# Patient Record
Sex: Male | Born: 1975 | Race: White | Hispanic: Yes | Marital: Married | Smoking: Former smoker
Health system: Southern US, Community
[De-identification: ages and names within clinical notes are randomized; demographics above are authoritative.]

## PROBLEM LIST (undated history)

## (undated) DIAGNOSIS — F988 Other specified behavioral and emotional disorders with onset usually occurring in childhood and adolescence: Secondary | ICD-10-CM

## (undated) DIAGNOSIS — R51 Headache: Secondary | ICD-10-CM

## (undated) DIAGNOSIS — T7840XA Allergy, unspecified, initial encounter: Secondary | ICD-10-CM

## (undated) DIAGNOSIS — J302 Other seasonal allergic rhinitis: Secondary | ICD-10-CM

## (undated) DIAGNOSIS — R519 Headache, unspecified: Secondary | ICD-10-CM

## (undated) DIAGNOSIS — E785 Hyperlipidemia, unspecified: Secondary | ICD-10-CM

## (undated) HISTORY — DX: Headache, unspecified: R51.9

## (undated) HISTORY — PX: HAND SURGERY: SHX662

## (undated) HISTORY — DX: Other specified behavioral and emotional disorders with onset usually occurring in childhood and adolescence: F98.8

## (undated) HISTORY — DX: Other seasonal allergic rhinitis: J30.2

## (undated) HISTORY — DX: Hyperlipidemia, unspecified: E78.5

## (undated) HISTORY — DX: Allergy, unspecified, initial encounter: T78.40XA

## (undated) HISTORY — DX: Headache: R51

---

## 2015-02-17 ENCOUNTER — Encounter: Payer: Self-pay | Admitting: Gastroenterology

## 2015-02-25 ENCOUNTER — Other Ambulatory Visit: Payer: Self-pay | Admitting: Orthopedic Surgery

## 2015-02-25 DIAGNOSIS — M4726 Other spondylosis with radiculopathy, lumbar region: Secondary | ICD-10-CM

## 2015-03-17 ENCOUNTER — Ambulatory Visit
Admission: RE | Admit: 2015-03-17 | Discharge: 2015-03-17 | Disposition: A | Discharge status: Home / Self Care | Payer: BLUE CROSS/BLUE SHIELD | Source: Ambulatory Visit | Attending: Orthopedic Surgery | Admitting: Orthopedic Surgery

## 2015-03-17 ENCOUNTER — Other Ambulatory Visit: Payer: Self-pay | Admitting: Orthopedic Surgery

## 2015-03-17 DIAGNOSIS — M4726 Other spondylosis with radiculopathy, lumbar region: Secondary | ICD-10-CM

## 2015-03-17 DIAGNOSIS — Z77018 Contact with and (suspected) exposure to other hazardous metals: Secondary | ICD-10-CM

## 2015-04-08 ENCOUNTER — Ambulatory Visit (INDEPENDENT_AMBULATORY_CARE_PROVIDER_SITE_OTHER): Payer: BLUE CROSS/BLUE SHIELD | Admitting: Gastroenterology

## 2015-04-08 ENCOUNTER — Encounter: Payer: Self-pay | Admitting: Gastroenterology

## 2015-04-08 VITALS — BP 130/80 | Ht 69.0 in | Wt 183.5 lb

## 2015-04-08 DIAGNOSIS — Z1211 Encounter for screening for malignant neoplasm of colon: Secondary | ICD-10-CM | POA: Diagnosis not present

## 2015-04-08 DIAGNOSIS — Z800 Family history of malignant neoplasm of digestive organs: Secondary | ICD-10-CM | POA: Diagnosis not present

## 2015-04-08 MED ORDER — NA SULFATE-K SULFATE-MG SULF 17.5-3.13-1.6 GM/177ML PO SOLN: ORAL | Status: DC

## 2015-04-08 NOTE — Progress Notes (Signed)
   HPI :  38 y/o male seen in consultation for colon cancer screening. His mother had colon cancer diagnosed at age 66. No other family members with colon cancer.   No prior colonoscopy. He denies any blood in the stools. No abdominal pains. No diarrhea or constipation. Eating well without complaints. No vomiting or nausea. He has lost roughly 12 lbs in 3-4 months when he started Adderrall and reports this was expected weight loss. He takes this for ADD.   Otherwise healthy without complaints. No history of anemia.    Past Medical History  Diagnosis Date  . Seasonal allergies   . Hyperlipidemia   . ADD (attention deficit disorder)      Past Surgical History  Procedure Laterality Date  . Hand surgery     Family History  Problem Relation Age of Onset  . Colon cancer Mother   . Diabetes Paternal Grandmother   . Hypertension Father   . Hypertension Mother   . ADD / ADHD Son    Social History  Substance Use Topics  . Smoking status: Former Research scientist (life sciences)  . Smokeless tobacco: None  . Alcohol Use: 1.8 oz/week    3 Standard drinks or equivalent per week     Comment: beer   Current Outpatient Prescriptions  Medication Sig Dispense Refill  . amphetamine-dextroamphetamine (ADDERALL) 10 MG tablet Take 10 mg by mouth 2 (two) times a week.     No current facility-administered medications for this visit.   No Known Allergies   Review of Systems: All systems reviewed and negative except where noted in HPI.   Labs from other provider reviewed from 12/13/2014 - normal Hgb of 14.6  Physical Exam: Ht 5\' 9"  (1.753 m)  Wt 183 lb 8 oz (83.235 kg)  BMI 27.09 kg/m2 Constitutional: Pleasant,well-developed, male in no acute distress. HEENT: Normocephalic and atraumatic. Conjunctivae are normal. No scleral icterus. Neck supple.  Cardiovascular: Normal rate, regular rhythm.  Pulmonary/chest: Effort normal and breath sounds normal. No wheezing, rales or rhonchi. Abdominal: Soft, nondistended,  nontender. Bowel sounds active throughout. There are no masses palpable. No hepatomegaly. Extremities: no edema Lymphadenopathy: No cervical adenopathy noted. Neurological: Alert and oriented to person place and time. Skin: Skin is warm and dry. No rashes noted. Psychiatric: Normal mood and affect. Behavior is normal.   ASSESSMENT AND PLAN: 39 y/o male with mother who had colon cancer at age 55, in need of first time CRC screening. He has no anemia or alarm symptoms. Generally feels well without complaints.   We discussed risk factors for colon cancer, and recommended colon cancer screening modalities and timing of screening modalities given his family history based on ACG guidelines. Given his family history recommend a colonoscopy starting now at age 46, and then every 5 years even if it is a normal exam, or sooner based on colonoscopy findings of he has multiple or high risk polyps .  The indications, risks, and benefits of colonoscopy were explained to the patient in detail. Risks include but are not limited to bleeding, perforation, adverse reaction to medications, and cardiopulmonary compromise. Sequelae include but are not limited to the possibility of surgery, hospitalization, and mortality. The patient verbalized understanding and wished to proceed. All questions answered, referred to the scheduler and bowel prep ordered. Further recommendations pending results of the exam.   [place] Cellar, MD [facility] Pager [phone]

## 2015-04-08 NOTE — Patient Instructions (Signed)
You have been scheduled for a colonoscopy. Please follow written instructions given to you at your visit today.  Please pick up your prep supplies at the pharmacy within the next 1-3 days. If you use inhalers (even only as needed), please bring them with you on the day of your procedure. Your physician has requested that you go to www.startemmi.com and enter the access code given to you at your visit today. This web site gives a general overview about your procedure. However, you should still follow specific instructions given to you by our office regarding your preparation for the procedure.  We have sent the following medications to your pharmacy for you to pick up at your convenience: Suprep  

## 2015-05-18 ENCOUNTER — Ambulatory Visit (INDEPENDENT_AMBULATORY_CARE_PROVIDER_SITE_OTHER): Payer: BLUE CROSS/BLUE SHIELD | Admitting: Medical

## 2015-05-18 ENCOUNTER — Encounter: Payer: Self-pay | Admitting: Medical

## 2015-05-18 VITALS — BP 120/82 | HR 88 | Temp 98.7°F | Ht 69.25 in | Wt 183.0 lb

## 2015-05-18 DIAGNOSIS — R0683 Snoring: Secondary | ICD-10-CM | POA: Insufficient documentation

## 2015-05-18 DIAGNOSIS — R51 Headache: Secondary | ICD-10-CM

## 2015-05-18 DIAGNOSIS — F988 Other specified behavioral and emotional disorders with onset usually occurring in childhood and adolescence: Secondary | ICD-10-CM | POA: Insufficient documentation

## 2015-05-18 DIAGNOSIS — J302 Other seasonal allergic rhinitis: Secondary | ICD-10-CM

## 2015-05-18 DIAGNOSIS — R5383 Other fatigue: Secondary | ICD-10-CM | POA: Diagnosis not present

## 2015-05-18 DIAGNOSIS — F909 Attention-deficit hyperactivity disorder, unspecified type: Secondary | ICD-10-CM

## 2015-05-18 DIAGNOSIS — E785 Hyperlipidemia, unspecified: Secondary | ICD-10-CM | POA: Diagnosis not present

## 2015-05-18 DIAGNOSIS — R519 Headache, unspecified: Secondary | ICD-10-CM | POA: Insufficient documentation

## 2015-05-18 NOTE — Assessment & Plan Note (Signed)
For your HA will work up your sleep issues and see if this resolves. During interim use alleve. If severe ha with neurologic signs or symptoms then ED evaluation. May in future get imaging studies or refer to neurologist depending on how you do.

## 2015-05-18 NOTE — Assessment & Plan Note (Addendum)
he noticed this about 3 years ago. Pt in past thought related to work travel and studying for [employer]. Future labs next week cbc, cmp, tsh.

## 2015-05-18 NOTE — Patient Instructions (Addendum)
For your ADD will follow but continue to see [place] attention specialist if you feel is needed.  For your fatigue and hyperlipidemia will get fasting labs within next week.   For your HA will work up your sleep issues and see if this resolves. During interim use alleve. If severe ha with neurologic signs or symptoms then ED evaluation. May in future get imaging studies or refer to neurologist depending on how you do.  Will refer to specialist for sleep apnea.  Would try to get more exercise if possible but early in the day.  Follow up in 3 wks or as needed

## 2015-05-18 NOTE — Assessment & Plan Note (Signed)
For your ADD will follow but continue to see [place] attention specialist if you feel is needed.

## 2015-05-18 NOTE — Progress Notes (Signed)
Subjective:    Patient ID: Keith Bryan, male    DOB: 08/11/1975, 39 y.o.   MRN: [id number]  HPI   I have reviewed pt PMH, PSH, FH, Social History and Surgical History  Seasonal allergies- pt takes allegra or  zyrtec occasional. Worse in the spring.  ADD- Pt had dx 6 years ago. Since youth had extreme difficulty concentrating. Pt has been using med for years. Pt has not taken adderal in one month.  Fatigue- he noticed this about 3 years ago. Pt in past thought related to work travel and studying for [employer].   Ha-no light sensitive or sound sensitivity. He will wake moderate ha for 1-2 hours then eases up. When he rests well and sleeps well less ha. When he uses respiratory mouth device he notice ha sometimes not present.  Pt states he sleeps 7-8 hours and does not feel well rested. He bought otc mouth device and he feels like he feels better rested. Pt wife state sthat he snores. Pt tried various sleep hygeine measures but still does not feel rested.  Hyperlipidemia- pt does not recall last reading. Was on medication simvistatin up until last year. Last check 6 months.  Back pain- pt seeing neurosurgeon. Also has seen PT. Back is better now.  Has MBA works at  [employer], Not working out recently, 2 cups coffee a day, married- 2 kids.  Review of Systems  Constitutional: Positive for fatigue. Negative for chills and unexpected weight change.  HENT: Negative for congestion, facial swelling, nosebleeds, rhinorrhea, sinus pressure and tinnitus.   Respiratory: Negative for apnea, cough, shortness of breath and wheezing.   Cardiovascular: Negative for chest pain and palpitations.  Gastrointestinal: Negative for abdominal pain.  Musculoskeletal: Negative for back pain.  Neurological: Negative for dizziness and light-headedness.  Hematological: Negative for adenopathy.  Psychiatric/Behavioral: Negative for suicidal ideas, confusion and dysphoric mood. The patient is not nervous/anxious.       Snoring.    Past Medical History  Diagnosis Date  . Seasonal allergies   . Hyperlipidemia   . ADD (attention deficit disorder)   . Frequent headaches   . Allergy     Social History   Social History  . Marital Status: Married    Spouse Name: N/A  . Number of Children: 2  . Years of Education: N/A   Occupational History  . scientist    Social History Main Topics  . Smoking status: Former Research scientist (life sciences)  . Smokeless tobacco: Never Used  . Alcohol Use: 1.8 oz/week    3 Standard drinks or equivalent per week     Comment: beer occasionally   . Drug Use: No  . Sexual Activity: Not on file   Other Topics Concern  . Not on file   Social History Narrative    Past Surgical History  Procedure Laterality Date  . Hand surgery      Family History  Problem Relation Age of Onset  . Colon cancer Mother   . Hypertension Mother   . Hyperlipidemia Mother   . Diabetes Paternal Grandmother   . Arthritis Paternal Grandmother   . Hypertension Father   . Hyperlipidemia Father   . ADD / ADHD Son   . Arthritis Maternal Grandmother   . Arthritis Maternal Grandfather   . Arthritis Paternal Grandfather     No Known Allergies  Current Outpatient Prescriptions on File Prior to Visit  Medication Sig Dispense Refill  . amphetamine-dextroamphetamine (ADDERALL) 10 MG tablet Take 10 mg by mouth 2 (  two) times a week.     No current facility-administered medications on file prior to visit.    BP 120/82 mmHg  Pulse 88  Temp(Src) 98.7 F (37.1 C) (Oral)  Ht 5' 9.25" (1.759 m)  Wt 183 lb (83.008 kg)  BMI 26.83 kg/m2  SpO2 98%        Objective:   Physical Exam  General Mental Status- Alert. General Appearance- Not in acute distress.   Skin General: Color- Normal Color. Moisture- Normal Moisture.  Neck Carotid Arteries- Normal color. Moisture- Normal Moisture. No carotid bruits. No JVD.  Chest and Lung Exam Auscultation: Breath Sounds:-Normal.  CTA.  Cardiovascular Auscultation:Rythm- Regular, Rate, and rythm Murmurs & Other Heart Sounds:Auscultation of the heart reveals- No Murmurs.  Abdomen Inspection:-Inspeection Normal. Palpation/Percussion:Note:No mass. Palpation and Percussion of the abdomen reveal- Non Tender, Non Distended + BS, no rebound or guarding.  Neurologic Cranial Nerve exam:- CN III-XII intact(No nystagmus), symmetric smile. Strength:- 5/5 equal and symmetric strength both upper and lower extremities.      Assessment & Plan:

## 2015-05-18 NOTE — Progress Notes (Signed)
Pre visit review using our clinic review tool, if applicable. No additional management support is needed unless otherwise documented below in the visit note. 

## 2015-05-18 NOTE — Assessment & Plan Note (Signed)
pt takes allegra or  zyrtec occasional. Worse in the spring

## 2015-05-18 NOTE — Assessment & Plan Note (Signed)
With non restorative sleep. Fatigue. Will refer for sleep apnea eval.

## 2015-05-23 ENCOUNTER — Other Ambulatory Visit (INDEPENDENT_AMBULATORY_CARE_PROVIDER_SITE_OTHER): Payer: BLUE CROSS/BLUE SHIELD

## 2015-05-23 DIAGNOSIS — R5383 Other fatigue: Secondary | ICD-10-CM

## 2015-05-23 DIAGNOSIS — E785 Hyperlipidemia, unspecified: Secondary | ICD-10-CM | POA: Diagnosis not present

## 2015-05-23 LAB — CBC WITH DIFFERENTIAL/PLATELET
BASOS ABS: 0 10*3/uL (ref 0.0–0.1)
BASOS PCT: 0.5 % (ref 0.0–3.0)
EOS ABS: 0.2 10*3/uL (ref 0.0–0.7)
Eosinophils Relative: 3.8 % (ref 0.0–5.0)
HEMATOCRIT: 43.5 % (ref 39.0–52.0)
Hemoglobin: 14.8 g/dL (ref 13.0–17.0)
LYMPHS PCT: 35.4 % (ref 12.0–46.0)
Lymphs Abs: 1.5 10*3/uL (ref 0.7–4.0)
MCHC: 34 g/dL (ref 30.0–36.0)
MCV: 85.5 fl (ref 78.0–100.0)
MONO ABS: 0.3 10*3/uL (ref 0.1–1.0)
Monocytes Relative: 7.1 % (ref 3.0–12.0)
NEUTROS ABS: 2.3 10*3/uL (ref 1.4–7.7)
NEUTROS PCT: 53.2 % (ref 43.0–77.0)
PLATELETS: 238 10*3/uL (ref 150.0–400.0)
RBC: 5.08 Mil/uL (ref 4.22–5.81)
RDW: 12.7 % (ref 11.5–15.5)
WBC: 4.3 10*3/uL (ref 4.0–10.5)

## 2015-05-23 LAB — COMPREHENSIVE METABOLIC PANEL
ALT: 19 U/L (ref 0–53)
AST: 18 U/L (ref 0–37)
Albumin: 4.4 g/dL (ref 3.5–5.2)
Alkaline Phosphatase: 66 U/L (ref 39–117)
BILIRUBIN TOTAL: 1 mg/dL (ref 0.2–1.2)
BUN: 16 mg/dL (ref 6–23)
CALCIUM: 9.7 mg/dL (ref 8.4–10.5)
CHLORIDE: 102 meq/L (ref 96–112)
CO2: 29 meq/L (ref 19–32)
CREATININE: 0.9 mg/dL (ref 0.40–1.50)
GFR: 99.26 mL/min (ref >60.00)
GLUCOSE: 89 mg/dL (ref 70–99)
Potassium: 3.9 mEq/L (ref 3.5–5.1)
SODIUM: 138 meq/L (ref 135–145)
Total Protein: 7.1 g/dL (ref 6.0–8.3)

## 2015-05-23 LAB — TSH: TSH: 2.67 u[IU]/mL (ref 0.35–4.50)

## 2015-05-23 LAB — LIPID PANEL
CHOL/HDL RATIO: 5
Cholesterol: 215 mg/dL — ABNORMAL HIGH (ref 0–200)
HDL: 42.8 mg/dL (ref >39.00)
NONHDL: 172.59
Triglycerides: 210 mg/dL — ABNORMAL HIGH (ref 0.0–149.0)
VLDL: 42 mg/dL — ABNORMAL HIGH (ref 0.0–40.0)

## 2015-05-23 LAB — LDL CHOLESTEROL, DIRECT: LDL DIRECT: 139 mg/dL

## 2015-05-27 ENCOUNTER — Telehealth: Payer: Self-pay

## 2015-05-27 DIAGNOSIS — E785 Hyperlipidemia, unspecified: Secondary | ICD-10-CM

## 2015-05-27 NOTE — Telephone Encounter (Signed)
Since muscle aches with simvistatin. Won't rx statin. Advise strict diet, exercise and krill oil for 3 months. Then repeat lipid panel fasting. See if still elevated then consider other rx options.

## 2015-05-27 NOTE — Telephone Encounter (Signed)
Spoke with pt and he states that he was previously on simvastatin and he had muscle cramps so he stopped medication. He stated that he has started exercising and watching the saturated fats in the diet. He is willing to try what would be recommended by PCP.

## 2015-05-27 NOTE — Telephone Encounter (Signed)
Pt notified and made aware of provider's recommendation.  He stated understanding and agreed with plan.  Repeat lab appointment scheduled.  Future lab appointment scheduled.

## 2015-06-09 ENCOUNTER — Encounter: Payer: Self-pay | Admitting: Medical

## 2015-06-09 ENCOUNTER — Ambulatory Visit (INDEPENDENT_AMBULATORY_CARE_PROVIDER_SITE_OTHER): Payer: BLUE CROSS/BLUE SHIELD | Admitting: Medical

## 2015-06-09 VITALS — BP 102/70 | HR 70 | Temp 98.0°F | Ht 69.0 in | Wt 180.0 lb

## 2015-06-09 DIAGNOSIS — E785 Hyperlipidemia, unspecified: Secondary | ICD-10-CM | POA: Diagnosis not present

## 2015-06-09 DIAGNOSIS — R0683 Snoring: Secondary | ICD-10-CM

## 2015-06-09 DIAGNOSIS — R5383 Other fatigue: Secondary | ICD-10-CM

## 2015-06-09 NOTE — Patient Instructions (Addendum)
See the pulmonologist for possible  sleep apnea. If your studies are not that impressive then consider occasional sleep agent.  Continue krill oil daily and exercise.  Discuss adderal impact use on sleep with specialist. They may increase on follow up with specialist.  Keith Bryan are resolved if they reoccur let me know.   Follow up end of march after lipid panel done

## 2015-06-09 NOTE — Progress Notes (Signed)
   Subjective:    Patient ID: Keith Bryan, male    DOB: 12/09/1975, 40 y.o.   MRN: [id number]  HPI  Pt in states he is feeling better energy after 12 days of vacation. Pt states while on vacation he slept  7-8 hours a day . When he works he sleeps about 6 hours. Sometimes watches tv until late. But he is changing this. He uses otc mouth device for presumed sleep apnea(got on [place]). He thinks this helps. He has sleep eval with pulmonologist late this month. Still snores.  Pt is on krill oil now for mild lipid elevation and exercising 20 minutes. Eating better.  Pt has follow up on [place] attention specialist. Pt will touch base with them about the impact of his med on his sleep.   Headache area much improved/not reoccring. He states these have stopped. He only has some rare  upper neck tightness early in the am.   Review of Systems  Constitutional: Negative for fever, chills, diaphoresis, activity change and fatigue.  Respiratory: Negative for cough, chest tightness and shortness of breath.   Cardiovascular: Negative for chest pain, palpitations and leg swelling.  Gastrointestinal: Negative for nausea, vomiting and abdominal pain.  Musculoskeletal: Negative for back pain, neck pain and neck stiffness.  Skin: Negative for rash.  Neurological: Negative for dizziness, seizures, speech difficulty, weakness, numbness and headaches.  Psychiatric/Behavioral: Positive for decreased concentration. Negative for behavioral problems, confusion and agitation. The patient is not nervous/anxious.        Will see his specialist for this.       Objective:   Physical Exam  General Mental Status- Alert. General Appearance- Not in acute distress.   Skin General: Color- Normal Color. Moisture- Normal Moisture.  Neck Carotid Arteries- Normal color. Moisture- Normal Moisture. No carotid bruits. No JVD.  Chest and Lung Exam Auscultation: Breath  Sounds:-Normal.  Cardiovascular Auscultation:Rythm- Regular. Murmurs & Other Heart Sounds:Auscultation of the heart reveals- No Murmurs.  Abdomen Inspection:-Inspeection Normal. Palpation/Percussion:Note:No mass. Palpation and Percussion of the abdomen reveal- Non Tender, Non Distended + BS, no rebound or guarding.    Neurologic Cranial Nerve exam:- CN III-XII intact(No nystagmus), symmetric smile. Strength:- 5/5 equal and symmetric strength both upper and lower extremities.      Assessment & Plan:  See the pulmonologist for possible  sleep apnea. If your studies are not that impressive then consider occasional sleep agent.  Continue krill oil daily and exercise.  Discuss adderal impact use on sleep with specialist. They may increase on follow up with specialist.  Lamonte Sakai are resolved if they reoccur let me know.   Follow up end of march after lipid panel done

## 2015-06-09 NOTE — Progress Notes (Signed)
Pre visit review using our clinic review tool, if applicable. No additional management support is needed unless otherwise documented below in the visit note. 

## 2015-06-22 ENCOUNTER — Encounter: Payer: Self-pay | Admitting: Gastroenterology

## 2015-06-22 ENCOUNTER — Ambulatory Visit (AMBULATORY_SURGERY_CENTER): Payer: BLUE CROSS/BLUE SHIELD | Admitting: Gastroenterology

## 2015-06-22 VITALS — BP 102/70 | HR 74 | Temp 98.1°F | Resp 12 | Ht 69.0 in | Wt 183.0 lb

## 2015-06-22 DIAGNOSIS — Z800 Family history of malignant neoplasm of digestive organs: Secondary | ICD-10-CM

## 2015-06-22 DIAGNOSIS — K635 Polyp of colon: Secondary | ICD-10-CM

## 2015-06-22 DIAGNOSIS — D124 Benign neoplasm of descending colon: Secondary | ICD-10-CM

## 2015-06-22 DIAGNOSIS — D123 Benign neoplasm of transverse colon: Secondary | ICD-10-CM | POA: Diagnosis not present

## 2015-06-22 DIAGNOSIS — D125 Benign neoplasm of sigmoid colon: Secondary | ICD-10-CM

## 2015-06-22 DIAGNOSIS — Z1211 Encounter for screening for malignant neoplasm of colon: Secondary | ICD-10-CM

## 2015-06-22 MED ORDER — SODIUM CHLORIDE 0.9 % IV SOLN: 500.0000 mL | INTRAVENOUS | Status: DC

## 2015-06-22 NOTE — Op Note (Signed)
[facility]  [address] [place], [id number]   COLONOSCOPY PROCEDURE REPORT  PATIENT: Keith Bryan, Keith Bryan  MR#: [id number] BIRTHDATE: [birth year] , 39  yrs. old GENDER: male ENDOSCOPIST: Yetta Flock, MD REFERRED BY: PROCEDURE DATE:  06/22/2015 PROCEDURE:   Colonoscopy, screening and Colonoscopy with snare polypectomy First Screening Colonoscopy - Avg.  risk and is 50 yrs.  old or older Yes.  Prior Negative Screening - Now for repeat screening. N/A  History of Adenoma - Now for follow-up colonoscopy & has been > or = to 3 yrs.  N/A  Polyps removed today? Yes ASA CLASS:   Class II INDICATIONS:Screening for colonic neoplasia and FH Colon or Rectal Adenocarcinoma. MEDICATIONS: Propofol 350 mg IV  DESCRIPTION OF PROCEDURE:   After the risks benefits and alternatives of the procedure were thoroughly explained, informed consent was obtained.  The digital rectal exam revealed no abnormalities of the rectum.   The LB [id number]  endoscope was introduced through the anus and advanced to the cecum, which was identified by both the appendix and ileocecal valve. No adverse events experienced.   The quality of the prep was good.  The instrument was then slowly withdrawn as the colon was fully examined. Estimated blood loss is zero unless otherwise noted in this procedure report.    COLON FINDINGS: A small nonbleeding AVM was noted in the cecum.  An isolated diverticulum was noted in the cecum.  A roughly 53mm sessile polyp was noted in the proximal transverse colon and removed with cold snare.  Two additional sessile transverse colon polyps roughly 4-15mm in size were removed with cold snare.  A 7mm sessile polyp was noted in the descending colon and removed via cold snare.  Three sessile polyps noted in the sigmoid colon roughly 4-23mm in size were removed with cold snare.  The remainder of the examined colon was normal.  Retroflexed views revealed internal  hemorrhoids. The time to cecum = 2.3 Withdrawal time = 25.8   The scope was withdrawn and the procedure completed. COMPLICATIONS: There were no immediate complications.     ENDOSCOPIC IMPRESSION: Small cecal AVM Isolated diverticulum in the cecum 7 polyps removed via cold snare Internal hemorrhoids  RECOMMENDATIONS: No NSAIDS for 2 weeks Await pathology results Resume diet Resume medications  eSigned:  Yetta Flock, MD 06/22/2015 3:00 PM   cc:  the patient   PATIENT NAME:  Keith Bryan, Keith Bryan MR#: [id number]

## 2015-06-22 NOTE — Progress Notes (Signed)
Called to room to assist during endoscopic procedure.  Patient ID and intended procedure confirmed with present staff. Received instructions for my participation in the procedure from the performing physician.  

## 2015-06-22 NOTE — Patient Instructions (Signed)
YOU HAD AN ENDOSCOPIC PROCEDURE TODAY AT [facility]:   Refer to the procedure report that was given to you for any specific questions about what was found during the examination.  If the procedure report does not answer your questions, please call your gastroenterologist to clarify.  If you requested that your care partner not be given the details of your procedure findings, then the procedure report has been included in a sealed envelope for you to review at your convenience later.  YOU SHOULD EXPECT: Some feelings of bloating in the abdomen. Passage of more gas than usual.  Walking can help get rid of the air that was put into your GI tract during the procedure and reduce the bloating. If you had a lower endoscopy (such as a colonoscopy or flexible sigmoidoscopy) you may notice spotting of blood in your stool or on the toilet paper. If you underwent a bowel prep for your procedure, you may not have a normal bowel movement for a few days.  Please Note:  You might notice some irritation and congestion in your nose or some drainage.  This is from the oxygen used during your procedure.  There is no need for concern and it should clear up in a day or so.  SYMPTOMS TO REPORT IMMEDIATELY:   Following lower endoscopy (colonoscopy or flexible sigmoidoscopy):  Excessive amounts of blood in the stool  Significant tenderness or worsening of abdominal pains  Swelling of the abdomen that is new, acute  Fever of 100F or higher  For urgent or emergent issues, a gastroenterologist can be reached at any hour by calling [phone].   DIET: Your first meal following the procedure should be a small meal and then it is ok to progress to your normal diet. Heavy or fried foods are harder to digest and may make you feel nauseous or bloated.  Likewise, meals heavy in dairy and vegetables can increase bloating.  Drink plenty of fluids but you should avoid alcoholic beverages for 24  hours.  ACTIVITY:  You should plan to take it easy for the rest of today and you should NOT DRIVE or use heavy machinery until tomorrow (because of the sedation medicines used during the test).    FOLLOW UP: Our staff will call the number listed on your records the next business day following your procedure to check on you and address any questions or concerns that you may have regarding the information given to you following your procedure. If we do not reach you, we will leave a message.  However, if you are feeling well and you are not experiencing any problems, there is no need to return our call.  We will assume that you have returned to your regular daily activities without incident.  If any biopsies were taken you will be contacted by phone or by letter within the next 1-3 weeks.  Please call us at [phone] if you have not heard about the biopsies in 3 weeks.    SIGNATURES/CONFIDENTIALITY: You and/or your care partner have signed paperwork which will be entered into your electronic medical record.  These signatures attest to the fact that that the information above on your After Visit Summary has been reviewed and is understood.  Full responsibility of the confidentiality of this discharge information lies with you and/or your care-partner.  Await pathology results Polyps/ hemorrhoids handout given Hold NSAIDS for 2 weeks

## 2015-06-22 NOTE — Progress Notes (Signed)
A/ox3, pleased with MAC, report to RN 

## 2015-06-23 ENCOUNTER — Telehealth: Payer: Self-pay | Admitting: *Deleted

## 2015-06-23 NOTE — Telephone Encounter (Signed)
Unable to leave message on f/u call as mailbox full

## 2015-06-24 ENCOUNTER — Ambulatory Visit (INDEPENDENT_AMBULATORY_CARE_PROVIDER_SITE_OTHER): Payer: BLUE CROSS/BLUE SHIELD | Admitting: Pulmonary Disease

## 2015-06-24 ENCOUNTER — Encounter: Payer: Self-pay | Admitting: Pulmonary Disease

## 2015-06-24 VITALS — BP 122/88 | HR 80 | Ht 69.0 in | Wt 180.0 lb

## 2015-06-24 DIAGNOSIS — R0683 Snoring: Secondary | ICD-10-CM | POA: Diagnosis not present

## 2015-06-24 NOTE — Patient Instructions (Signed)
We will schedule you for a home sleep study.   Follow-up as needed after results reviewed

## 2015-06-24 NOTE — Progress Notes (Signed)
Subjective:    Patient ID: Keith Bryan, male    DOB: 1976/04/24, 40 y.o.   MRN: [id number]  HPI Consult for evaluation of sleep apnea.  Keith Bryan is a 40 year old with past medical history as below. He had complains of waking up with headache last year. He was working towards [employer]. At that time as was under tremendous stress. Since his graduation in mid October 16 he took a vacation and has a better regimen with lower stress. He reports no more headaches since then. He also has mild snoring and was told to get checked up for obstructive sleep apnea. He was found to have high triglycerides and cholesterol by his primary care physician. He started an exercise regimen over the past few months with cardio and weight training. He's lost about 10 pounds  He goes to sleep better around 11:30. He falls asleep within a few minutes. He gets up 2-3 times daily as the bathroom and to check on his toddler kids.  He has mild snoring at night. There are no witnessed apneas. He does not take any sleep aids or stimulant medication. He drinks 2 cups of coffee usually before. He exercises between 5-7pm He wakes up feeling refreshed. He does not have any daytime sleepiness. He uses his ipad for a few hours before falling asleep. He is trying to cut down on this. He does not have any symptoms of parasomnias, or narcolepsy.  Eppworth sleepiness score 06/24/15 - 0  Social history: He is a never smoker, 1-2 alcohol drinks per week, no illegal drug use. 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 There are no exposures at work or at home. He is married with 2 kids. He lives with his family. He is an immigrant from [place]  Family history: Emphysema-grandmother Childhood asthma-mother   Past Medical History  Diagnosis Date  . Seasonal allergies   . Hyperlipidemia   . ADD (attention deficit disorder)   . Frequent headaches   . Allergy     Current outpatient prescriptions:  .  amphetamine-dextroamphetamine  (ADDERALL) 10 MG tablet, Take 10 mg by mouth 2 (two) times a week. Reported on 06/09/2015, Disp: , Rfl:  .  Grape Seed OIL, by Does not apply route., Disp: , Rfl:  .  KRILL OIL PO, Take 1 capsule by mouth., Disp: , Rfl:  .  Multiple Vitamin (MULTIVITAMIN) capsule, Take 1 capsule by mouth daily., Disp: , Rfl:    Review of Systems  Constitutional: Negative for fever and unexpected weight change.  HENT: Negative for congestion, dental problem, ear pain, nosebleeds, postnasal drip, rhinorrhea, sinus pressure, sneezing, sore throat and trouble swallowing.   Eyes: Negative.  Negative for redness and itching.  Respiratory: Negative.  Negative for cough, chest tightness, shortness of breath and wheezing.   Cardiovascular: Negative.  Negative for palpitations and leg swelling.  Gastrointestinal: Negative.  Negative for nausea and vomiting.  Endocrine: Negative.   Genitourinary: Negative.  Negative for dysuria.  Musculoskeletal: Negative.  Negative for joint swelling.  Skin: Positive for rash.  Allergic/Immunologic: Positive for environmental allergies.  Neurological: Positive for headaches.  Hematological: Negative.  Does not bruise/bleed easily.  Psychiatric/Behavioral: Positive for hallucinations and dysphoric mood. The patient is not nervous/anxious.       Objective:   Physical Exam Blood pressure 122/88, pulse 80, height 5\' 9"  (1.753 m), weight 180 lb (81.647 kg), SpO2 98 %. Gen:  No apparent distress Neuro: No gross focal deficits. Neck: No JVD, lymphadenopathy, thyromegaly. RS: Clear,  no wheeze, crackles CVS: S1-S2 heard, no murmurs rubs gallops. Abdomen: Soft, positive bowel sounds. Extremities: No edema.    Assessment & Plan:  Evaluation for OSA.  He does not have much evidence for OSA except for mild snoring at night. I believe his insomnia, headaches was due to stress while he was working towards his [employer]. Symptoms are better now with reduced stress. I discussed sleep hygiene  techniques including limiting screen time, stimulants before sleep.  I will order a home sleep study. He will follow up as needed.  Plan: - Home sleep study.  Marshell Garfinkel MD [facility] Pager [phone] If no answer or after 3pm call: [phone] 06/24/2015, 4:57 PM

## 2015-06-29 ENCOUNTER — Encounter: Payer: Self-pay | Admitting: Gastroenterology

## 2015-06-30 DIAGNOSIS — G4733 Obstructive sleep apnea (adult) (pediatric): Secondary | ICD-10-CM | POA: Diagnosis not present

## 2015-07-06 DIAGNOSIS — G4733 Obstructive sleep apnea (adult) (pediatric): Secondary | ICD-10-CM | POA: Diagnosis not present

## 2015-07-07 ENCOUNTER — Other Ambulatory Visit: Payer: Self-pay | Admitting: *Deleted

## 2015-07-07 DIAGNOSIS — R0683 Snoring: Secondary | ICD-10-CM

## 2015-08-04 ENCOUNTER — Ambulatory Visit: Payer: BLUE CROSS/BLUE SHIELD | Admitting: Pulmonary Disease

## 2015-08-15 ENCOUNTER — Telehealth: Payer: Self-pay | Admitting: Medical

## 2015-08-15 NOTE — Telephone Encounter (Signed)
Pt did have a normal tsh in December 2016. Does he want another one ran? If so we need diagnosis. Is he fatigued? If so you would you put in order associate it with fatigue and I will sign.

## 2015-08-15 NOTE — Telephone Encounter (Signed)
Pt says that he also has to have a Thyroid screening. Pt would like to have orders put in so that he can complete with lab appt.

## 2015-08-15 NOTE — Telephone Encounter (Signed)
Please advise if orders will need to be placed.

## 2015-08-16 ENCOUNTER — Other Ambulatory Visit (INDEPENDENT_AMBULATORY_CARE_PROVIDER_SITE_OTHER): Payer: BLUE CROSS/BLUE SHIELD

## 2015-08-16 DIAGNOSIS — R5383 Other fatigue: Secondary | ICD-10-CM | POA: Diagnosis not present

## 2015-08-16 DIAGNOSIS — E785 Hyperlipidemia, unspecified: Secondary | ICD-10-CM | POA: Diagnosis not present

## 2015-08-16 LAB — LIPID PANEL
CHOL/HDL RATIO: 5
Cholesterol: 206 mg/dL — ABNORMAL HIGH (ref 0–200)
HDL: 44.8 mg/dL (ref >39.00)
LDL CALC: 132 mg/dL — AB (ref 0–99)
NonHDL: 160.96
Triglycerides: 143 mg/dL (ref 0.0–149.0)
VLDL: 28.6 mg/dL (ref 0.0–40.0)

## 2015-08-16 LAB — TSH: TSH: 2.13 u[IU]/mL (ref 0.35–4.50)

## 2015-08-16 NOTE — Addendum Note (Signed)
Addended by: Peggyann Shoals on: 08/16/2015 07:13 AM   Modules accepted: Orders

## 2015-08-16 NOTE — Telephone Encounter (Signed)
Spoke with pt and he states that he was needing blood work sent to Dr. Johnnye Sima and that his office was requesting recent blood work.

## 2015-08-23 ENCOUNTER — Ambulatory Visit (INDEPENDENT_AMBULATORY_CARE_PROVIDER_SITE_OTHER): Payer: BLUE CROSS/BLUE SHIELD | Admitting: Medical

## 2015-08-23 ENCOUNTER — Encounter: Payer: Self-pay | Admitting: Medical

## 2015-08-23 VITALS — BP 116/74 | HR 76 | Temp 98.2°F | Resp 16 | Ht 69.0 in | Wt 181.0 lb

## 2015-08-23 DIAGNOSIS — Z309 Encounter for contraceptive management, unspecified: Secondary | ICD-10-CM

## 2015-08-23 DIAGNOSIS — E785 Hyperlipidemia, unspecified: Secondary | ICD-10-CM | POA: Diagnosis not present

## 2015-08-23 DIAGNOSIS — Z3009 Encounter for other general counseling and advice on contraception: Secondary | ICD-10-CM

## 2015-08-23 MED ORDER — EZETIMIBE 10 MG PO TABS: 10.0000 mg | ORAL_TABLET | Freq: Every day | ORAL | Status: AC

## 2015-08-23 NOTE — Progress Notes (Signed)
Pre visit review using our clinic review tool, if applicable. No additional management support is needed unless otherwise documented below in the visit note. 

## 2015-08-23 NOTE — Progress Notes (Signed)
Subjective:    Patient ID: Keith Bryan, male    DOB: 12-28-1975, 40 y.o.   MRN: [id number]  HPI  Pt in for follow up. In December his total cholesterol was 215, his tryiglycerides were 210 and ldl was 139. Most recently total cholesterol 206, triglycerides were 143 and ldl was 132.  Pt had been on krill oil.  Pt has been taking fish oil for about 3 months. Pt grandad had some CAD. But no MI. Pt parents have htn. Dad has high cholesterol.   Pt describes that since last saw him he is eating very healthy. Pt just joined a gym.   Pt states in past he had some cramping in his calfs. He reports directly correlate simvastatin to cramps. But willing to try other type.  Pt is interviewing for new job within his company. Currently loosing present job.       Review of Systems  Constitutional: Negative for fever, chills and fatigue.  HENT: Negative for congestion.   Respiratory: Negative for cough, choking, shortness of breath and wheezing.   Cardiovascular: Negative for chest pain and palpitations.  Gastrointestinal: Negative for abdominal pain.  Musculoskeletal: Negative for back pain.  Skin: Negative for rash.  Neurological: Negative for dizziness and headaches.  Hematological: Negative for adenopathy. Does not bruise/bleed easily.  Psychiatric/Behavioral: Negative for behavioral problems, confusion and self-injury. The patient is not nervous/anxious.     Past Medical History  Diagnosis Date  . Seasonal allergies   . Hyperlipidemia   . ADD (attention deficit disorder)   . Frequent headaches   . Allergy     Social History   Social History  . Marital Status: Married    Spouse Name: N/A  . Number of Children: 2  . Years of Education: N/A   Occupational History  . scientist    Social History Main Topics  . Smoking status: Former Research scientist (life sciences)  . Smokeless tobacco: Never Used  . Alcohol Use: 1.8 oz/week    3 Standard drinks or equivalent per week     Comment: beer  occasionally   . Drug Use: No  . Sexual Activity: Not on file   Other Topics Concern  . Not on file   Social History Narrative    Past Surgical History  Procedure Laterality Date  . Hand surgery      Family History  Problem Relation Age of Onset  . Hypertension Mother   . Hyperlipidemia Mother   . Colon cancer Mother 78  . Diabetes Paternal Grandmother   . Arthritis Paternal Grandmother   . Hypertension Father   . Hyperlipidemia Father   . ADD / ADHD Son   . Arthritis Maternal Grandmother   . Arthritis Maternal Grandfather   . Arthritis Paternal Grandfather     No Known Allergies  Current Outpatient Prescriptions on File Prior to Visit  Medication Sig Dispense Refill  . amphetamine-dextroamphetamine (ADDERALL) 10 MG tablet Take 10 mg by mouth 2 (two) times a week. Reported on 06/09/2015    . Grape Seed OIL by Does not apply route.    Marland Kitchen KRILL OIL PO Take 1 capsule by mouth.    . Multiple Vitamin (MULTIVITAMIN) capsule Take 1 capsule by mouth daily.     No current facility-administered medications on file prior to visit.    BP 116/74 mmHg  Pulse 76  Temp(Src) 98.2 F (36.8 C) (Oral)  Resp 16  Ht 5\' 9"  (1.753 m)  Wt 181 lb (82.101 kg)  BMI 26.72 kg/m2  SpO2 97%       Objective:   Physical Exam  General Mental Status- Alert. General Appearance- Not in acute distress.   Skin General: Color- Normal Color. Moisture- Normal Moisture.  Neck Carotid Arteries- Normal color. Moisture- Normal Moisture. No carotid bruits. No JVD.  Chest and Lung Exam Auscultation: Breath Sounds:-Normal.  Cardiovascular Auscultation:Rythm- Regular. Murmurs & Other Heart Sounds:Auscultation of the heart reveals- No Murmurs.  Abdomen Inspection:-Inspeection Normal. Palpation/Percussion:Note:No mass. Palpation and Percussion of the abdomen reveal- Non Tender, Non Distended + BS, no rebound or guarding.   Neurologic Cranial Nerve exam:- CN III-XII intact(No nystagmus),  symmetric smile. Strength:- 5/5 equal and symmetric strength both upper and lower extremities.       Assessment & Plan:  With your high cholesterol, family history, and persisting levels despite diet and exercise, I decided to give you zetia. This is not a statin since you had some reaction to statin in the past.  Will follow up in 3 months or as needed.

## 2015-08-23 NOTE — Patient Instructions (Addendum)
With your high cholesterol, family history, and persisting levels despite diet and exercise, I decided to give you zetia. This is not a statin since you had some reaction to statin in the past.  Will follow up in 3 months or as needed.  At end pt request referral for vasectomy.

## 2015-08-25 ENCOUNTER — Ambulatory Visit (INDEPENDENT_AMBULATORY_CARE_PROVIDER_SITE_OTHER): Payer: BLUE CROSS/BLUE SHIELD | Admitting: Adult Health

## 2015-08-25 ENCOUNTER — Encounter: Payer: Self-pay | Admitting: Adult Health

## 2015-08-25 ENCOUNTER — Other Ambulatory Visit: Payer: BLUE CROSS/BLUE SHIELD

## 2015-08-25 VITALS — BP 128/79 | HR 100 | Temp 98.1°F | Ht 70.0 in | Wt 183.0 lb

## 2015-08-25 DIAGNOSIS — G4733 Obstructive sleep apnea (adult) (pediatric): Secondary | ICD-10-CM

## 2015-08-25 NOTE — Patient Instructions (Signed)
Begin CPAP At bedtime   Goal is to wear for at least 4hr each night .  Do not drive if sleepy.  Remain active .  Follow up with Dr. Elsworth Soho  In 6 weeks with download.  Please contact office for sooner follow up if symptoms do not improve or worsen or seek emergency care

## 2015-08-25 NOTE — Assessment & Plan Note (Signed)
Mild OSA -proceed with CPAP .  Pt education on OSA and CPAP   Plan  Begin CPAP At bedtime   Goal is to wear for at least 4hr each night .  Do not drive if sleepy.  Remain active .  Follow up with Dr. Elsworth Soho  In 6 weeks with download.  Please contact office for sooner follow up if symptoms do not improve or worsen or seek emergency care

## 2015-08-25 NOTE — Progress Notes (Signed)
Subjective:    Patient ID: Keith Bryan, male    DOB: [birth year], 40 y.o.   MRN: [id number]  HPI  40 yo male seen for sleep consult 06/24/15 with Dr. Vaughan Browner for daytime tiredness and headaches.   TEST  HST 06/30/15 with AHI 8.9 /hr , (23 obstructive/34 central apneas, 9 mixed, and 9 hypopneas) + SaO2 low 86%.   08/25/2015 Follow up : OSA  Pt returns for follow up for mild OSA  Pt was seen in January for sleep consult for daytime tiredness and headaches. He was set up for a home sleep study .  This was done on 06/30/15 with AHI 8.9 /hr , (23 obstructive/34 central apneas, 9 mixed, and 9 hypopneas) + SaO2 low 86%. We discussed this showed mild OSA .  Discussed treatment options including CPAP and oral appliance .  He would like to proceed with CPAP .  He denies chest pain, orthopnea, edema or fever.  Lost 15lbs since holidays. Working on wt loss.     Past Medical History  Diagnosis Date  . Seasonal allergies   . Hyperlipidemia   . ADD (attention deficit disorder)   . Frequent headaches   . Allergy    Current Outpatient Prescriptions on File Prior to Visit  Medication Sig Dispense Refill  . amphetamine-dextroamphetamine (ADDERALL) 10 MG tablet Take 30 mg by mouth daily with breakfast. Reported on 06/09/2015    . ezetimibe (ZETIA) 10 MG tablet Take 1 tablet (10 mg total) by mouth daily. 30 tablet 3  . Grape Seed OIL by Does not apply route.    Marland Kitchen KRILL OIL PO Take 1 capsule by mouth.    . Multiple Vitamin (MULTIVITAMIN) capsule Take 1 capsule by mouth daily.     No current facility-administered medications on file prior to visit.      Review of Systems Constitutional:   No  weight loss, night sweats,  Fevers, chills, fatigue, or  lassitude.  HEENT:   No headaches,  Difficulty swallowing,  Tooth/dental problems, or  Sore throat,                No sneezing, itching, ear ache, nasal congestion, post nasal drip,   CV:  No chest pain,  Orthopnea, PND, swelling in lower  extremities, anasarca, dizziness, palpitations, syncope.   GI  No heartburn, indigestion, abdominal pain, nausea, vomiting, diarrhea, change in bowel habits, loss of appetite, bloody stools.   Resp: No shortness of breath with exertion or at rest.  No excess mucus, no productive cough,  No non-productive cough,  No coughing up of blood.  No change in color of mucus.  No wheezing.  No chest wall deformity  Skin: no rash or lesions.  GU: no dysuria, change in color of urine, no urgency or frequency.  No flank pain, no hematuria   MS:  No joint pain or swelling.  No decreased range of motion.  No back pain.  Psych:  No change in mood or affect. No depression or anxiety.  No memory loss.          Objective:   Physical Exam Filed Vitals:   08/25/15 0918  BP: 128/79  Pulse: 100  Temp: 98.1 F (36.7 C)  TempSrc: Oral  Height: 5\' 10"  (1.778 m)  Weight: 183 lb (83.008 kg)  SpO2: 99%   Body mass index is 26.26 kg/(m^2).  GEN: A/Ox3; pleasant , NAD, well nourished   HEENT:  [place]/AT,  EACs-clear, TMs-wnl, NOSE-clear, THROAT-clear, no lesions, no postnasal  drip or exudate noted. Class MP 2-3 airway .   NECK:  Supple w/ fair ROM; no JVD; normal carotid impulses w/o bruits; no thyromegaly or nodules palpated; no lymphadenopathy.  RESP  Clear  P & A; w/o, wheezes/ rales/ or rhonchi.no accessory muscle use, no dullness to percussion  CARD:  RRR, no m/r/g  , no peripheral edema, pulses intact, no cyanosis or clubbing.  GI:   Soft & nt; nml bowel sounds; no organomegaly or masses detected.  Musco: Warm bil, no deformities or joint swelling noted.   Neuro: alert, no focal deficits noted.    Skin: Warm, no lesions or rashes  Kollins Fenter NP-C  [facility]  08/25/2015        Assessment & Plan:

## 2015-08-29 ENCOUNTER — Telehealth: Payer: Self-pay | Admitting: Pulmonary Disease

## 2015-08-29 NOTE — Telephone Encounter (Signed)
Spoke with Lynn/[facility]- requests HST results and OV notes prior to HST and after.  These have been printed and faxed to [facility] at [phone] Nothing further needed.

## 2015-08-30 ENCOUNTER — Ambulatory Visit: Payer: BLUE CROSS/BLUE SHIELD | Admitting: Medical

## 2015-08-30 NOTE — Progress Notes (Signed)
Reviewed & agree with plan  

## 2015-08-31 ENCOUNTER — Telehealth: Payer: Self-pay | Admitting: Adult Health

## 2015-08-31 NOTE — Telephone Encounter (Signed)
Spoke with Jeani Hawking at [facility]. States that they have not been able to get a hold of the pt to set up his CPAP. Per our order, this was to done by the end of the month. Jeani Hawking states that they are going to continue to keep trying to get in touch with him. If they can't, then they will wait until the pt returns from [place]. Nothing further was needed at this time.

## 2015-09-14 ENCOUNTER — Encounter: Payer: Self-pay | Admitting: Medical

## 2015-09-14 ENCOUNTER — Ambulatory Visit (INDEPENDENT_AMBULATORY_CARE_PROVIDER_SITE_OTHER): Payer: BLUE CROSS/BLUE SHIELD | Admitting: Medical

## 2015-09-14 VITALS — BP 118/78 | HR 98 | Temp 100.8°F | Ht 70.0 in | Wt 186.4 lb

## 2015-09-14 DIAGNOSIS — R509 Fever, unspecified: Secondary | ICD-10-CM | POA: Diagnosis not present

## 2015-09-14 DIAGNOSIS — J029 Acute pharyngitis, unspecified: Secondary | ICD-10-CM | POA: Diagnosis not present

## 2015-09-14 DIAGNOSIS — J020 Streptococcal pharyngitis: Secondary | ICD-10-CM | POA: Diagnosis not present

## 2015-09-14 LAB — POC INFLUENZA A&B (BINAX/QUICKVUE)
INFLUENZA A, POC: NEGATIVE
INFLUENZA B, POC: NEGATIVE

## 2015-09-14 LAB — POCT RAPID STREP A (OFFICE): RAPID STREP A SCREEN: POSITIVE — AB

## 2015-09-14 MED ORDER — AZITHROMYCIN 250 MG PO TABS: ORAL_TABLET | ORAL | Status: AC

## 2015-09-14 NOTE — Patient Instructions (Signed)
Your strep test was positive. I am prescribing azithromycin antibiotic. Rest hydrate, tylenol for fever and warm salt water gargles. Follow up in 7 days or as needed. 

## 2015-09-14 NOTE — Progress Notes (Signed)
Subjective:    Patient ID: Keith Bryan, male    DOB: Jan 17, 1976, 40 y.o.   MRN: [id number]  HPI   Pt in with st and fever. Pt has since Monday. Some body aches as well. Pt strep test +. Flu test was negative.   Some mild-moderate pain on swallowing.   Review of Systems  Constitutional: Positive for fever. Negative for chills and fatigue.  HENT: Positive for sore throat. Negative for congestion, postnasal drip and sinus pressure.   Respiratory: Negative for cough and wheezing.   Cardiovascular: Negative for chest pain and palpitations.  Gastrointestinal: Negative for abdominal pain.  Musculoskeletal: Positive for myalgias. Negative for back pain.  Skin: Negative for rash.  Neurological: Negative for dizziness and headaches.  Hematological: Positive for adenopathy.  Psychiatric/Behavioral: Negative for behavioral problems and confusion.    Past Medical History  Diagnosis Date  . Seasonal allergies   . Hyperlipidemia   . ADD (attention deficit disorder)   . Frequent headaches   . Allergy     Social History   Social History  . Marital Status: Married    Spouse Name: N/A  . Number of Children: 2  . Years of Education: N/A   Occupational History  . scientist    Social History Main Topics  . Smoking status: Former Research scientist (life sciences)  . Smokeless tobacco: Never Used     Comment: smokes occasionally  . Alcohol Use: 1.8 oz/week    3 Standard drinks or equivalent per week     Comment: beer occasionally   . Drug Use: No  . Sexual Activity: Not on file   Other Topics Concern  . Not on file   Social History Narrative    Past Surgical History  Procedure Laterality Date  . Hand surgery      Family History  Problem Relation Age of Onset  . Hypertension Mother   . Hyperlipidemia Mother   . Colon cancer Mother 59  . Diabetes Paternal Grandmother   . Arthritis Paternal Grandmother   . Hypertension Father   . Hyperlipidemia Father   . ADD / ADHD Son   . Arthritis  Maternal Grandmother   . Arthritis Maternal Grandfather   . Arthritis Paternal Grandfather     No Known Allergies  Current Outpatient Prescriptions on File Prior to Visit  Medication Sig Dispense Refill  . amphetamine-dextroamphetamine (ADDERALL) 10 MG tablet Take 30 mg by mouth daily with breakfast. Reported on 06/09/2015    . ezetimibe (ZETIA) 10 MG tablet Take 1 tablet (10 mg total) by mouth daily. 30 tablet 3  . Grape Seed OIL by Does not apply route.    Marland Kitchen KRILL OIL PO Take 1 capsule by mouth.    . Multiple Vitamin (MULTIVITAMIN) capsule Take 1 capsule by mouth daily.     No current facility-administered medications on file prior to visit.    BP 118/78 mmHg  Pulse 98  Temp(Src) 100.8 F (38.2 C) (Oral)  Ht 5\' 10"  (1.778 m)  Wt 186 lb 6.4 oz (84.55 kg)  BMI 26.75 kg/m2  SpO2 97%       Objective:   Physical Exam  General- No acute distress, pleasant pt.  Neck- from, No nuccal rigidity, Mild submandibular node hypertrophy. Both tm mild/faint  red.  Lungs- Clear even and unlabored.  Heart- Regular, rate and rhythm. HEENT- Head- normocephalic Eyes- PEERL bilaterally. Ears- Canals clear, normal tm's bilaterally. Nose- No frontal or maxillary sinus tenderness to palpation. Turbinates normal. Throat- posterior pharynx shows  Moderate   tonsillar hypertrophy 1+  plus,  Erythma, no   discharge.   Neurologic- CN III- XII grossly intact.     Assessment & Plan:  Your strep test was positive. I am prescribing azithromycin  antibiotic. Rest hydrate, tylenol for fever and warm salt water gargles. Follow up in 7 days or as needed.

## 2015-09-14 NOTE — Progress Notes (Signed)
Pre visit review using our clinic review tool, if applicable. No additional management support is needed unless otherwise documented below in the visit note. 

## 2015-10-19 ENCOUNTER — Telehealth: Payer: Self-pay | Admitting: Pulmonary Disease

## 2015-10-19 NOTE — Telephone Encounter (Signed)
Patient was scheduled to see Dr. Elsworth Soho in [facility] office tomorrow for CPAP follow up, patient states that he has never received his CPAP.  Order was sent as an EXPEDITED order in March, because patient was going to [place] and needed the machine before going to [place].  Patient needs the machine ASAP.  Patient will be leaving to go back to [place] in 2 weeks and needs this machine.  Cancelled patient's appointment for tomorrow since his CPAP has not been received.    Rodena Piety, please advise how quickly we can process this order.

## 2015-10-20 ENCOUNTER — Ambulatory Visit: Payer: BLUE CROSS/BLUE SHIELD | Admitting: Pulmonary Disease

## 2015-10-20 NOTE — Telephone Encounter (Signed)
Dawne stated that she spoke with DME.  DME advised her that they had contacted patient and was unable to get the CPAP to him before he left for [place].  Patient stated to them that he would call them back when he got back from [place], but he never returned the call.  DME was advised that patient is moving to [place], they stated that they cannot give him a machine to take to [place] if he is moving there because he has no way of doing follow up visits.  They recommended that patient obtain CPAP machine from a specialist in [place].     Called and advised patient of above.  Patient states that insurance will not pay for the machine in [place] that is why he needs to get it here.  Advised patient that he would need to contact [facility] to discuss with them options. Patient stated that he is driving right now and will call me back to get [facility] phone number so he can call them. Awaiting call back to give [facility] number.

## 2016-10-19 IMAGING — MR MR LUMBAR SPINE W/O CM
4 of 5 series · 24 of 48 positions shown · non-contrast
Comparison: None.

CLINICAL DATA: Lumbar radiculopathy. Low back pain since 2666.
Bilateral foot numbness, tingling, and burning intermittently.

EXAM:
MRI LUMBAR SPINE WITHOUT CONTRAST
TECHNIQUE: Multiplanar, multisequence MR imaging of the lumbar spine was
performed. No intravenous contrast was administered.

[Series 2: T2 · sagittal · 4.0mm · 0.73mm/px · 7 of 15 slices shown (1 of 2)]
[im 1/15]
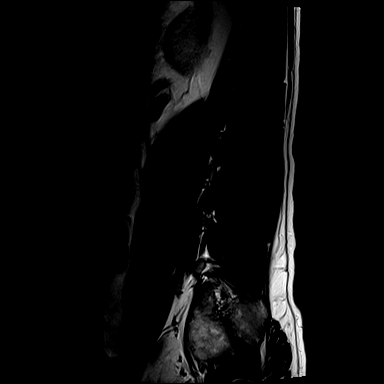
[im 3/15]
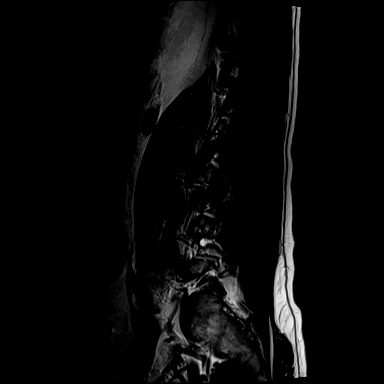
[im 5/15]
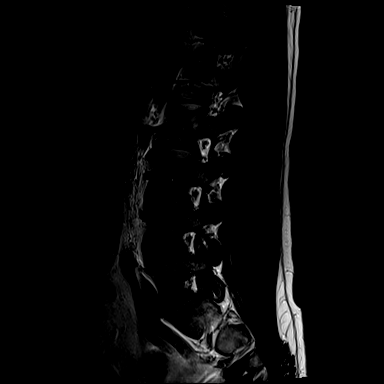
[im 8/15]
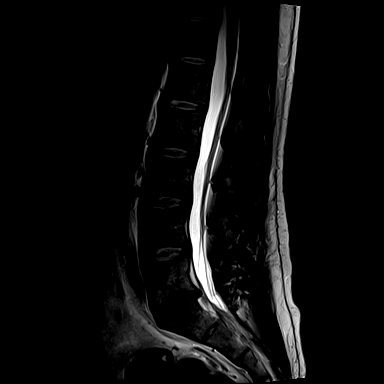
[im 10/15]
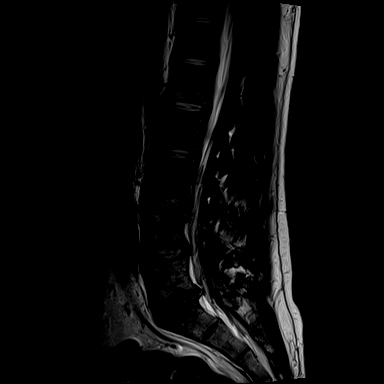
[im 12/15]
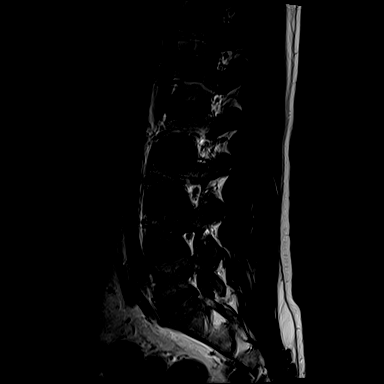
[im 15/15]
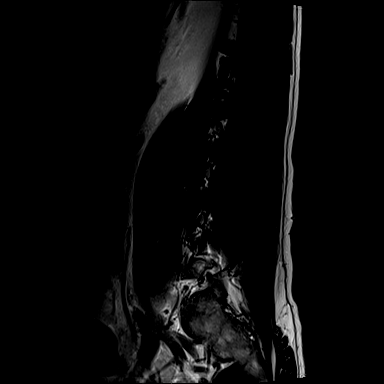

[Series 4: T1 · sagittal · 4.0mm · 0.88mm/px · 6 of 15 slices shown (1 of 2)]
[im 1/15]
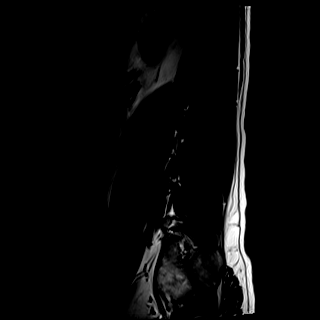
[im 3/15]
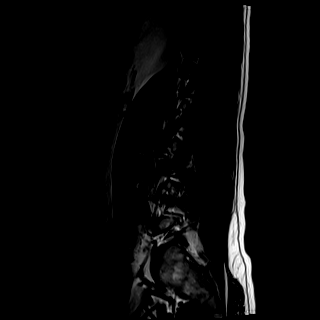
[im 6/15]
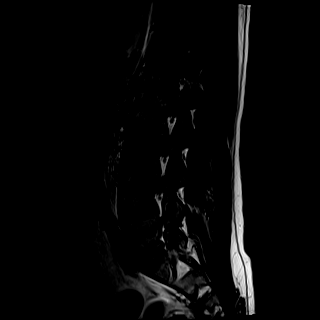
[im 9/15]
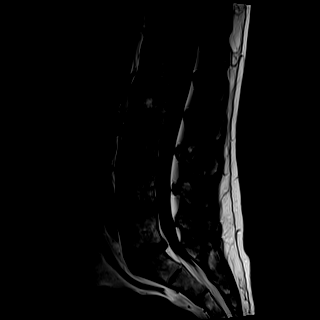
[im 12/15]
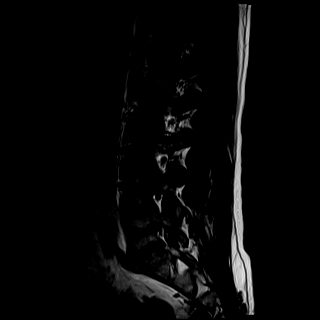
[im 15/15]
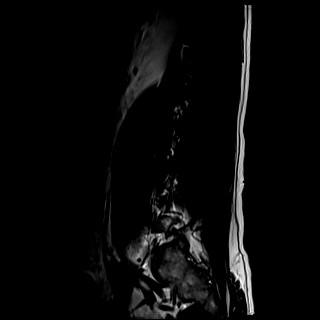

[Series 5: T2 · axial · 4.0mm · 0.28mm/px · z∈[-118,+87]mm · 8 of 33 slices shown (2 of 2)]
[im 1/33]
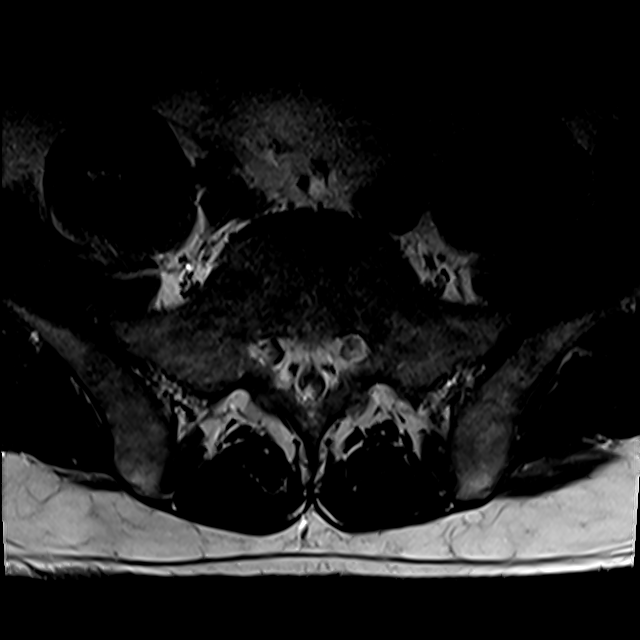
[im 5/33]
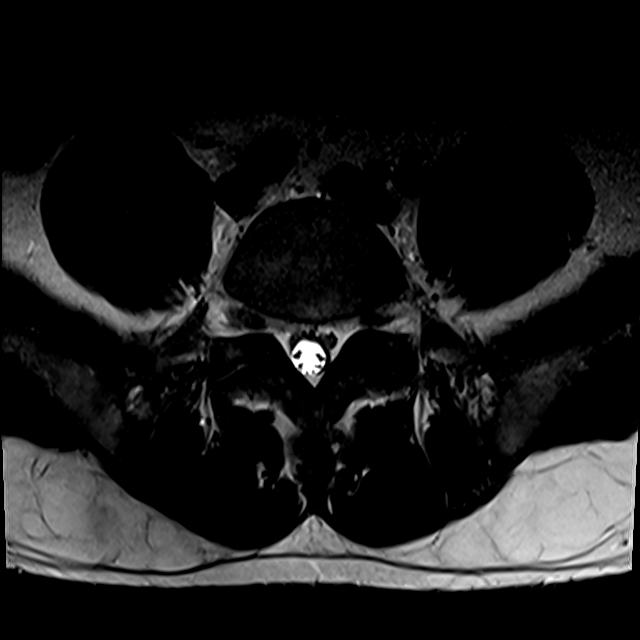
[im 10/33]
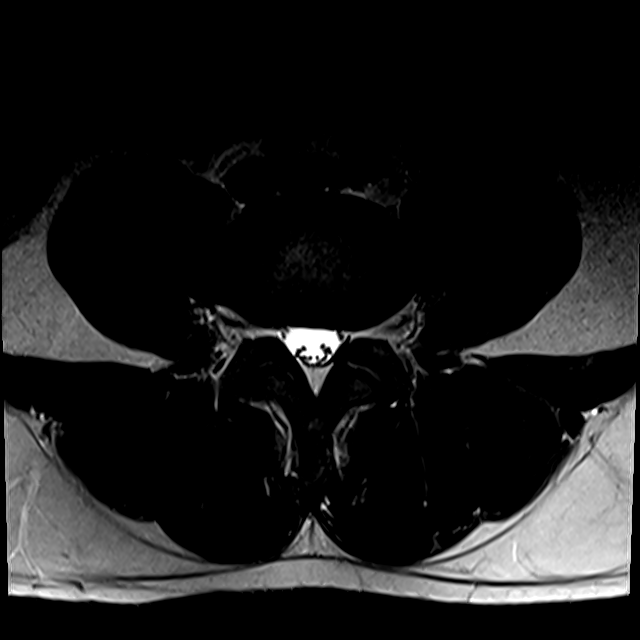
[im 15/33]
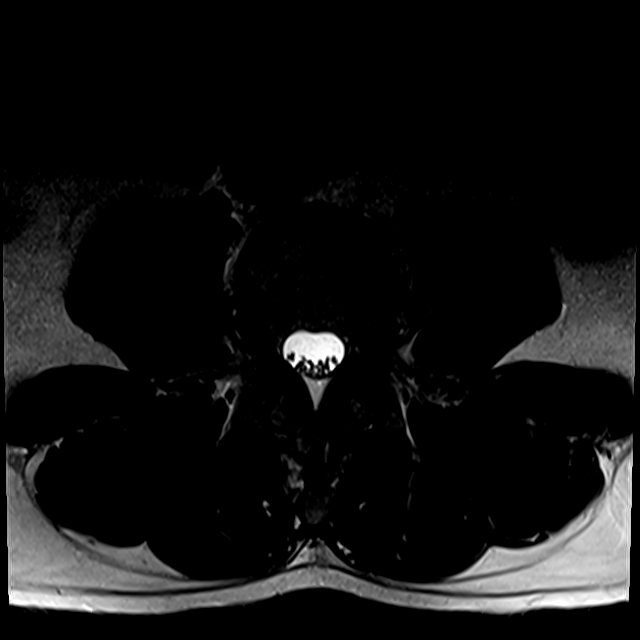
[im 18/33]
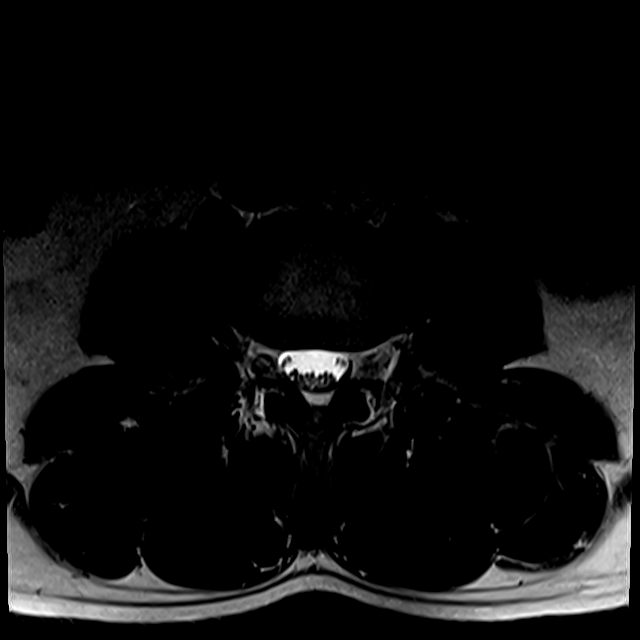
[im 23/33]
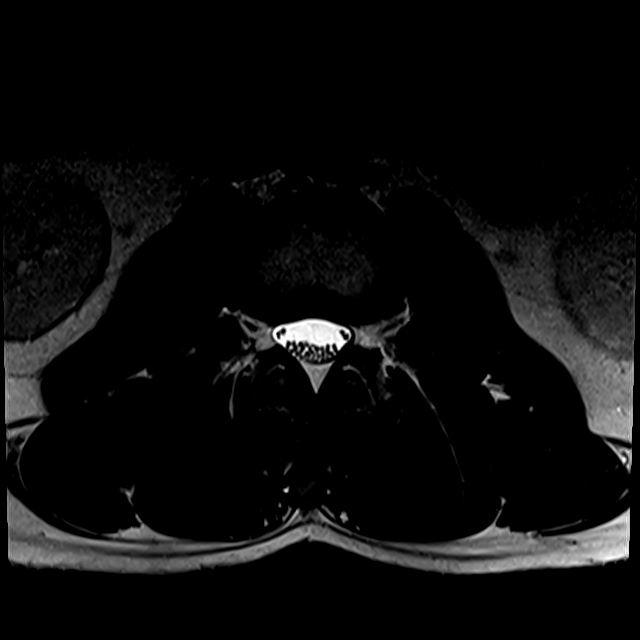
[im 28/33]
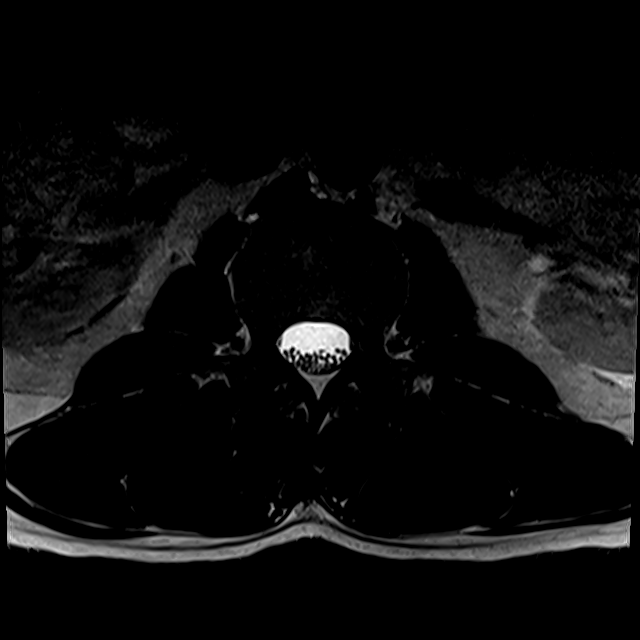
[im 33/33]
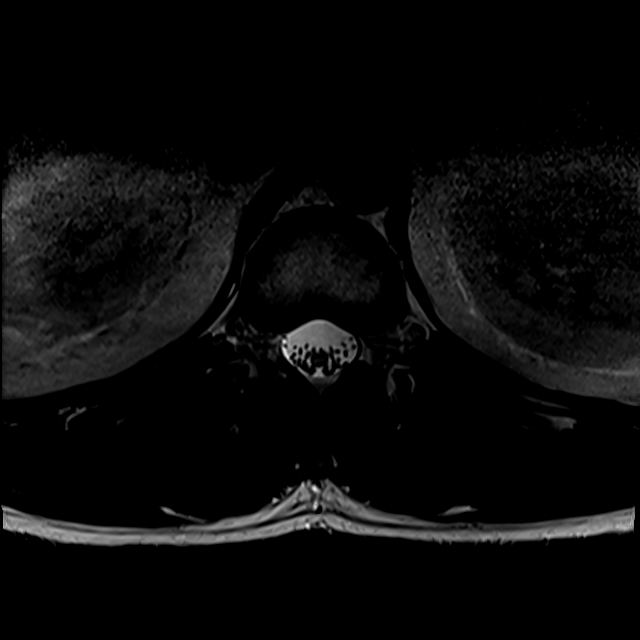

[Series 6: T1 · axial · 4.0mm · 0.56mm/px · z∈[-98,+40]mm · 3 of 33 slices shown (2 of 2)]
[im 5/33]
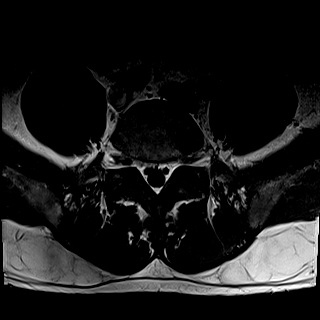
[im 18/33]
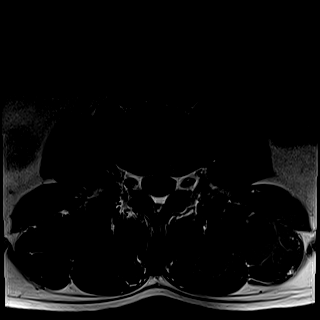
[im 28/33]
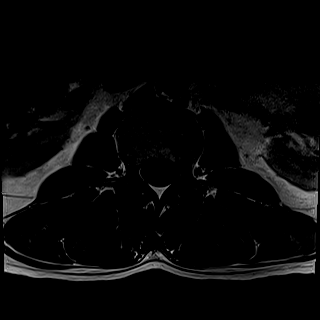

[24 of 48 positions shown; findings below may reference images not displayed]

FINDINGS: For the purposes of this dictation, the lowest well-formed
intervertebral disc space is presumed to be L5-S1.

There is trace retrolisthesis of L5 on S1. Vertebral body heights
are preserved. Disc desiccation mild disc space narrowing are
present at L5-S1. Intervertebral discs elsewhere are well hydrated
and maintained in height. A small L2 vertebral body hemangioma is
noted. Very mild type 2 degenerative endplate changes are present
L5-S1. No vertebral marrow edema is seen. Conus medullaris is normal
in signal and terminates at the superior aspect of L1. A retro
aortic left renal vein is incidentally noted.

T12-L1 through L3-4:  No disc herniation or stenosis.

L4-5: Mild facet hypertrophy without disc herniation or stenosis.

L5-S1: Mild disc bulging without spinal canal or neural foraminal
stenosis. Disc approaches but does not clearly contact or displace
the S1 nerve roots in the lateral recesses, more so on the right. A
right subarticular annular fissure is noted.
IMPRESSION: Single level disc degeneration at L5-S1 without evidence of neural
impingement.

## 2018-07-10 ENCOUNTER — Encounter: Payer: Self-pay | Admitting: Gastroenterology
